# Patient Record
Sex: Female | Born: 1960 | Race: Black or African American | Hispanic: No | Marital: Married | Smoking: Current some day smoker
Health system: Southern US, Community
[De-identification: ages and names within clinical notes are randomized; demographics above are authoritative.]

---

## 2007-05-16 ENCOUNTER — Emergency Department (HOSPITAL_COMMUNITY): Admission: EM | Admit: 2007-05-16 | Discharge: 2007-05-16 | Payer: Self-pay | Admitting: Emergency Medicine

## 2008-01-07 IMAGING — CR DG CHEST 2V
2 series · 2 of 2 positions shown · non-contrast
Comparison: None.

CLINICAL DATA: Right-sided chest pain. Shortness of breath.

CHEST - 2 VIEW  05/16/2007:

[view not recorded (1 of 2)]
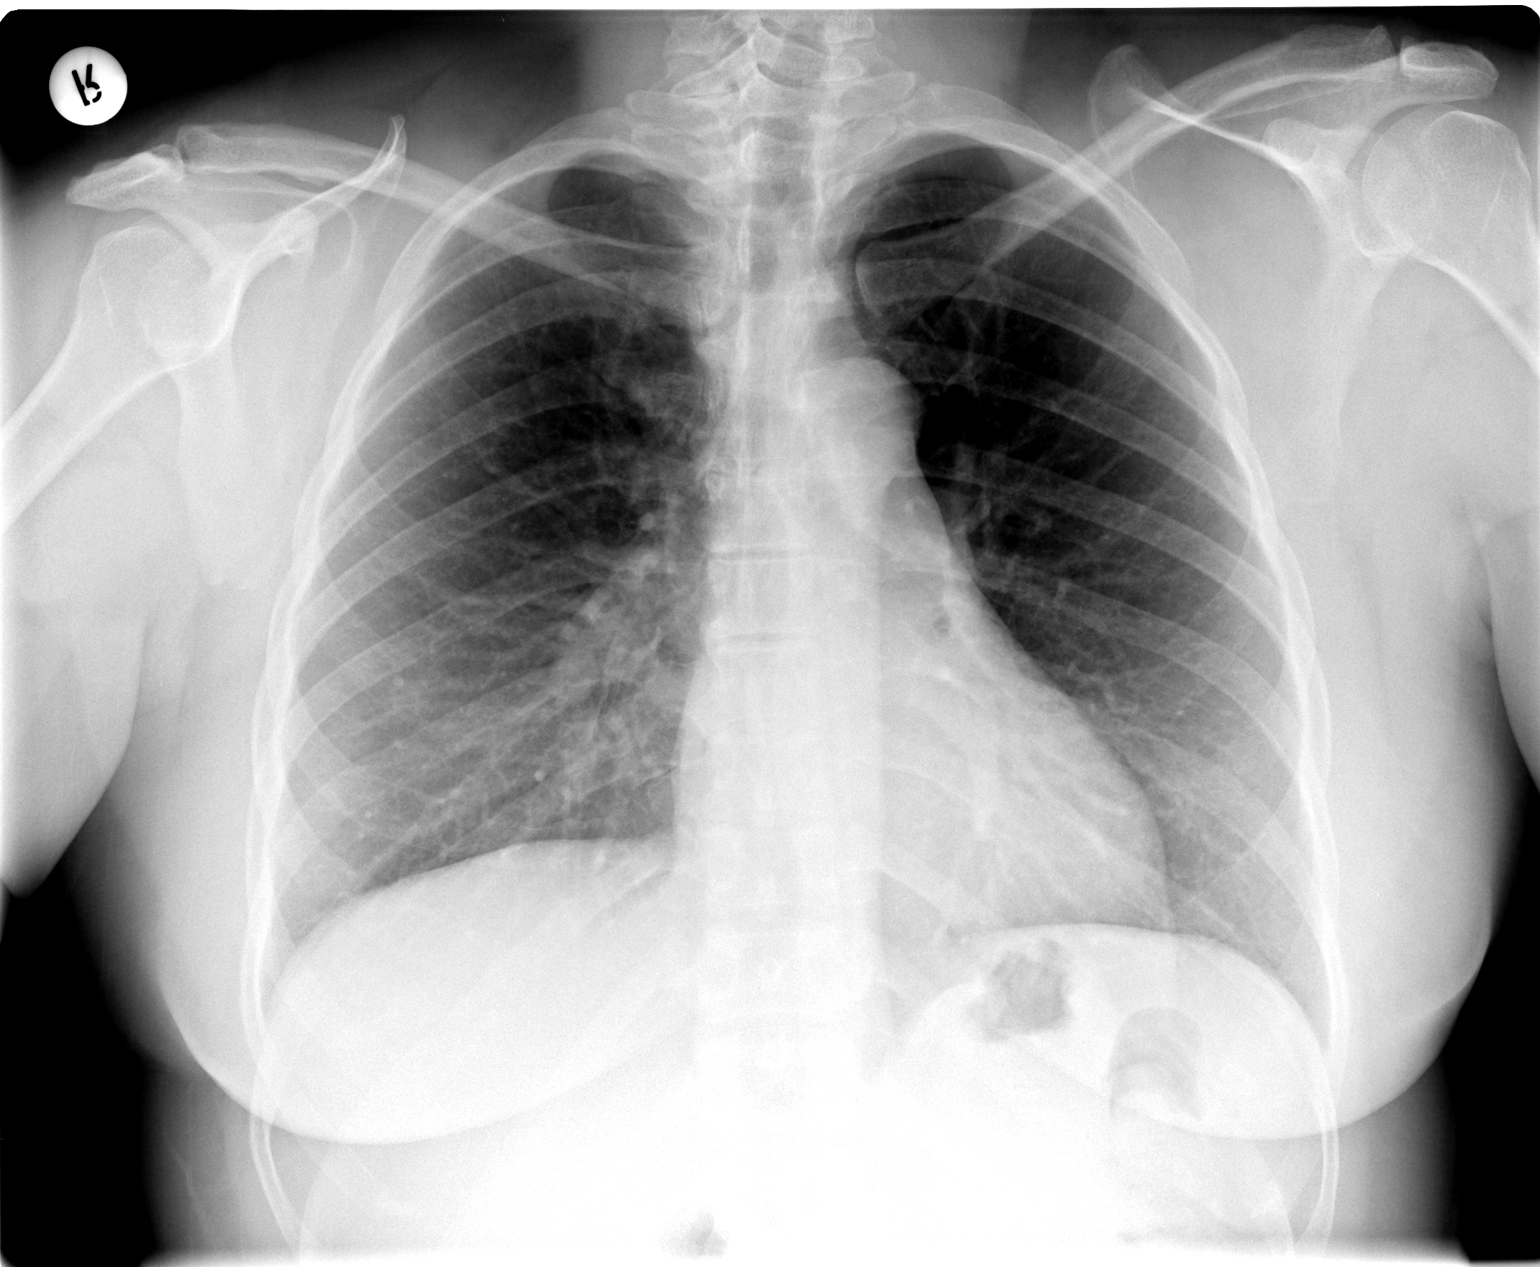

[view not recorded (2 of 2)]
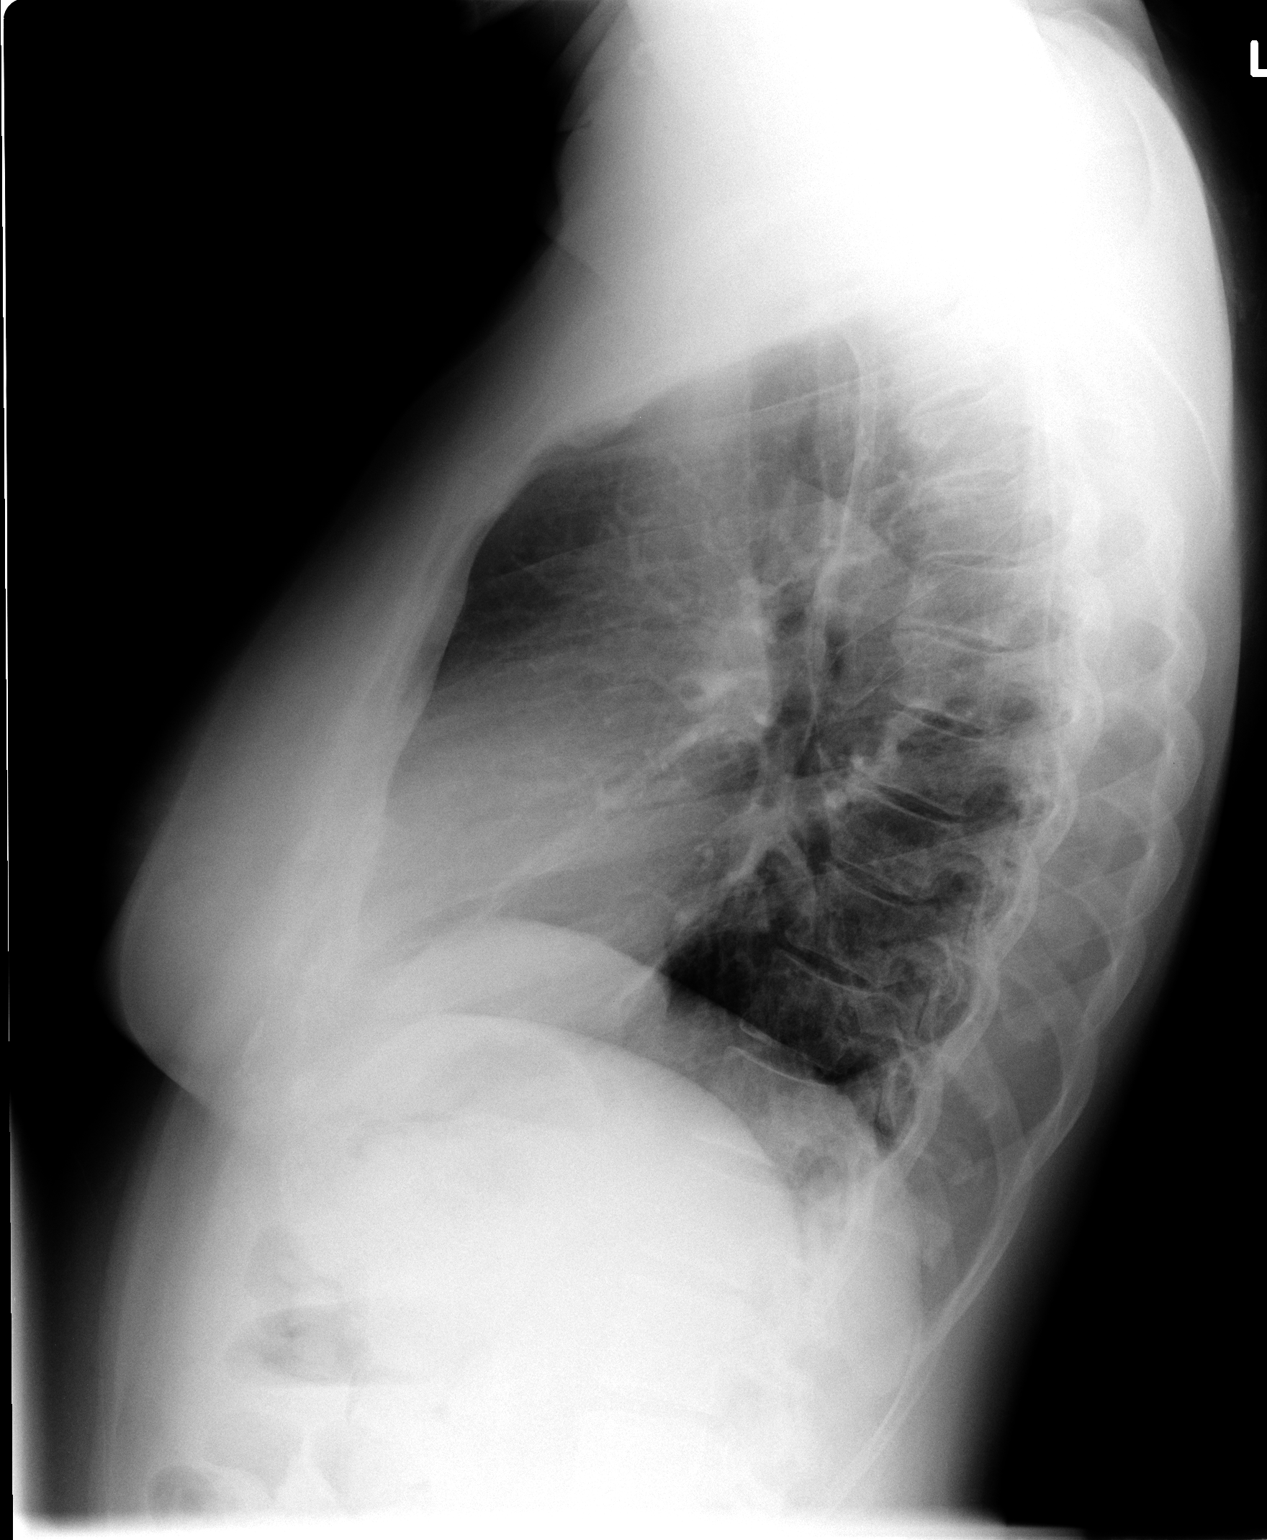

[2 of 2 positions shown; findings below may reference images not displayed]

FINDINGS: Cardiomediastinal silhouette unremarkable for age. Lungs clear. No
pleural effusions. Minimal degenerative changes in the midthoracic spine.
IMPRESSION: No acute cardiopulmonary disease.

## 2010-06-03 ENCOUNTER — Emergency Department (HOSPITAL_COMMUNITY): Admission: EM | Admit: 2010-06-03 | Discharge: 2010-06-03 | Payer: Self-pay | Admitting: Emergency Medicine

## 2011-08-27 LAB — DIFFERENTIAL
Basophils Absolute: 0
Basophils Relative: 1
Eosinophils Absolute: 0.2
Eosinophils Relative: 4
Lymphocytes Relative: 34
Lymphs Abs: 1.7
Monocytes Absolute: 0.3
Monocytes Relative: 7
Neutro Abs: 2.7
Neutrophils Relative %: 55

## 2011-08-27 LAB — I-STAT 8, (EC8 V) (CONVERTED LAB)
BUN: 13
Chloride: 107
Glucose, Bld: 94
Hemoglobin: 13.3
Potassium: 4
Sodium: 137
pH, Ven: 7.453 — ABNORMAL HIGH

## 2011-08-27 LAB — CBC
HCT: 36.9
Hemoglobin: 12.7
MCHC: 34.4
MCV: 92.2
Platelets: 260
RBC: 4
RDW: 13.6
WBC: 4.9

## 2011-08-27 LAB — D-DIMER, QUANTITATIVE: D-Dimer, Quant: 0.32

## 2011-08-27 LAB — POCT I-STAT CREATININE: Operator id: 270961

## 2022-04-06 ENCOUNTER — Encounter (HOSPITAL_COMMUNITY): Payer: Self-pay | Admitting: Emergency Medicine

## 2022-04-06 ENCOUNTER — Other Ambulatory Visit: Payer: Self-pay

## 2022-04-06 ENCOUNTER — Encounter (HOSPITAL_COMMUNITY)

## 2022-04-06 ENCOUNTER — Ambulatory Visit (HOSPITAL_COMMUNITY): Admission: EM | Admit: 2022-04-06 | Discharge: 2022-04-06 | Disposition: A | Discharge status: Home / Self Care

## 2022-04-06 ENCOUNTER — Encounter (HOSPITAL_COMMUNITY): Payer: Self-pay | Admitting: *Deleted

## 2022-04-06 ENCOUNTER — Emergency Department (HOSPITAL_BASED_OUTPATIENT_CLINIC_OR_DEPARTMENT_OTHER)

## 2022-04-06 ENCOUNTER — Emergency Department (HOSPITAL_COMMUNITY)
Admission: EM | Admit: 2022-04-06 | Discharge: 2022-04-06 | Disposition: A | Discharge status: Home / Self Care | Attending: Emergency Medicine | Admitting: Emergency Medicine

## 2022-04-06 DIAGNOSIS — Z7901 Long term (current) use of anticoagulants: Secondary | ICD-10-CM | POA: Diagnosis not present

## 2022-04-06 DIAGNOSIS — M79661 Pain in right lower leg: Secondary | ICD-10-CM

## 2022-04-06 DIAGNOSIS — I82461 Acute embolism and thrombosis of right calf muscular vein: Secondary | ICD-10-CM | POA: Insufficient documentation

## 2022-04-06 DIAGNOSIS — M79604 Pain in right leg: Secondary | ICD-10-CM | POA: Diagnosis not present

## 2022-04-06 LAB — MAGNESIUM: Magnesium: 2.1 mg/dL (ref 1.7–2.4)

## 2022-04-06 LAB — BASIC METABOLIC PANEL
Anion gap: 9 (ref 5–15)
BUN: 16 mg/dL (ref 8–23)
CO2: 24 mmol/L (ref 22–32)
Calcium: 9.9 mg/dL (ref 8.9–10.3)
Chloride: 101 mmol/L (ref 98–111)
Creatinine, Ser: 1.42 mg/dL — ABNORMAL HIGH (ref 0.44–1.00)
GFR, Estimated: 42 mL/min — ABNORMAL LOW (ref >60)
Glucose, Bld: 105 mg/dL — ABNORMAL HIGH (ref 70–99)
Potassium: 4 mmol/L (ref 3.5–5.1)
Sodium: 134 mmol/L — ABNORMAL LOW (ref 135–145)

## 2022-04-06 MED ORDER — APIXABAN 5 MG PO TABS
10.0000 mg | ORAL_TABLET | ORAL | Status: AC
  Administered 2022-04-06: 10 mg via ORAL
  Filled 2022-04-06: qty 2

## 2022-04-06 MED ORDER — APIXABAN 5 MG PO TABS: ORAL_TABLET | ORAL | 0 refills | Status: AC

## 2022-04-06 NOTE — Progress Notes (Signed)
VASCULAR LAB    Right lower extremity venous duplex has been performed.  See CV proc for preliminary results.  Gave Wylder Alvo, PA-C verbal report  Sherren Kerns, RVT 04/06/2022, 4:55 PM

## 2022-04-06 NOTE — ED Provider Notes (Signed)
[facility]    CSN: [id number] Arrival date & time: 04/06/22  1209      History   Chief Complaint Chief Complaint  Patient presents with   Leg Pain    HPI Rebecca Love is a 61 y.o. female.   Patient presents urgent care for evaluation of her right calf pain that started 2 days ago on Thursday, Apr 04, 2022 after she drove to [place] from [place].  Pain became worse Friday morning and to Friday night, so patient took a muscle relaxer pill that she had at home with no relief of pain and the muscle relaxer only made her sleepy.  She has also been taking Tylenol 500 mg with minimal relief.  Right leg is uncomfortable to the point where she struggles to drive and get in and out of her car.  She walks with an antalgic gait due to right calf pain.  Right calf is not warm or swollen.  Area of greatest tenderness is behind right knee.  Patient received a cortisone injection to right knee for her ongoing knee problems on February 25, 2022.  She states that this pain feels different.  She denies history of deep vein thrombosis.  No shortness of breath, chest pain, nausea, fever, chills, or abdominal pain reported.  Pain is worse when he is in extended position and relieved when knee is flexed at 90 degrees.  Right calf pain is currently a 9 on a scale of 0-10 and patient describes pain as a pulling sensation.   Leg Pain  History reviewed. No pertinent past medical history.  There are no problems to display for this patient.   History reviewed. No pertinent surgical history.  OB History   No obstetric history on file.      Home Medications    Prior to Admission medications   Not on File    Family History History reviewed. No pertinent family history.  Social History Social History   Tobacco Use   Smoking status: Some Days    Types: Cigarettes   Smokeless tobacco: Never  Substance Use Topics   Alcohol use: Yes   Drug use: Not Currently     Allergies    Lisinopril   Review of Systems Review of Systems Per HPI  Physical Exam Triage Vital Signs ED Triage Vitals  Enc Vitals Group     BP 04/06/22 1313 114/75     Pulse Rate 04/06/22 1313 68     Resp 04/06/22 1313 18     Temp 04/06/22 1313 98.8 F (37.1 C)     Temp src --      SpO2 04/06/22 1313 96 %     Weight --      Height --      Head Circumference --      Peak Flow --      Pain Score 04/06/22 1310 8     Pain Loc --      Pain Edu? --      Excl. in GC? --    No data found.  Updated Vital Signs BP 114/75   Pulse 68   Temp 98.8 F (37.1 C)   Resp 18   SpO2 96%   Visual Acuity Right Eye Distance:   Left Eye Distance:   Bilateral Distance:    Right Eye Near:   Left Eye Near:    Bilateral Near:     Physical Exam Vitals and nursing note reviewed.  Constitutional:  General: She is not in acute distress.    Appearance: Normal appearance. She is well-developed. She is not ill-appearing.  HENT:     Head: Normocephalic and atraumatic.     Right Ear: External ear normal.     Left Ear: External ear normal.     Nose: Nose normal.     Mouth/Throat:     Mouth: Mucous membranes are moist.  Eyes:     Extraocular Movements: Extraocular movements intact.     Conjunctiva/sclera: Conjunctivae normal.  Cardiovascular:     Rate and Rhythm: Normal rate and regular rhythm.     Heart sounds: Normal heart sounds. No murmur heard.   No friction rub. No gallop.  Pulmonary:     Effort: Pulmonary effort is normal. No respiratory distress.     Breath sounds: Normal breath sounds. No wheezing, rhonchi or rales.  Chest:     Chest wall: No tenderness.  Abdominal:     Palpations: Abdomen is soft.  Musculoskeletal:        General: No swelling.     Cervical back: Neck supple.     Right lower leg: Tenderness present. No edema.     Left lower leg: No tenderness. No edema.     Comments: Calf and popliteal area tenderness to right knee with palpation.  Positive Homans' sign.   Patient extremely tender to calf with extension of right knee when her leg is straightened.  Pain is relieved when she is able to bend her right knee.  Pain is worsened with walking and bearing any weight on her right leg.  Right calf is not red, swollen, or warm to touch.  No pain to left calf or left knee.  +2 anterior tibialis pulses bilaterally.  No numbness or tingling reported to bilateral lower extremities.  She is neurovascularly intact.  Skin:    General: Skin is warm and dry.     Capillary Refill: Capillary refill takes less than 2 seconds.     Findings: No rash.  Neurological:     General: No focal deficit present.     Mental Status: She is alert and oriented to person, place, and time. Mental status is at baseline.     Motor: No weakness.     Gait: Gait abnormal.     Comments: Antalgic gait due to right calf pain.  Psychiatric:        Mood and Affect: Mood normal.        Behavior: Behavior normal.        Thought Content: Thought content normal.        Judgment: Judgment normal.     UC Treatments / Results  Labs (all labs ordered are listed, but only abnormal results are displayed) Labs Reviewed - No data to display  EKG   Radiology No results found.  Procedures Procedures (including critical care time)  Medications Ordered in UC Medications - No data to display  Initial Impression / Assessment and Plan / UC Course  I have reviewed the triage vital signs and the nursing notes.  Pertinent labs & imaging results that were available during my care of the patient were reviewed by me and considered in my medical decision making (see chart for details).  Patient is a 61 year old female presenting to urgent care today with right calf pain and popliteal knee pain that started 3 days ago.  Calf is not red, swollen, or warm to touch, but Denna Haggard' sign is positive and patient walks with an antalgic  gait.  She denies any recent long distance travel other than traveling from  [place] to [place] days ago by car.  No chest pain, fever/chills or shortness of breath at this time.  Cardiopulmonary exam and vital signs are stable at this time.  Cannot rule out deep vein thrombosis due to lack of advanced imaging at urgent care.  Recommend patient go to the emergency department for vascular ultrasound study of the right leg due to symptoms.  Patient verbalizes understanding and agreement with plan.  Discussed risks of deferring emergency evaluation today with patient.  Patient to report to closest emergency department immediately for evaluation.  She is stable to go by personal vehicle.  Patient discharged in stable condition from urgent care.  Final Clinical Impressions(s) / UC Diagnoses   Final diagnoses:  Right leg pain     Discharge Instructions      Please report to the nearest emergency department immediately for further evaluation due to possible blood clot in your right leg causing your leg pain.     ED Prescriptions   None    PDMP not reviewed this encounter.   Carlisle BeersStanhope, Anber Mckiver M, [place]NP 04/06/22 1525

## 2022-04-06 NOTE — ED Triage Notes (Signed)
C/o R calf pain x 1 week that is worse since driving to [place] from [place] on Thursday.  Denies SOB.  Denies injury.

## 2022-04-06 NOTE — ED Provider Notes (Signed)
[facility] EM[facility]ENC[facility] Provider Note   CSN: [id number] Arrival date & time: 04/06/22  1404     History  Chief Complaint  Patient presents with   Leg Pain    Phynix B Wirtanen is a 61 y.o. female.   Leg Pain Patient is a 61 year old female with a past medical history significant for hypertension, hyperlipidemia on medications for the above  She is presented to the emergency room today with complaints of some mild right calf pain for the past week but over the past 3 days she has had significant worsening in her symptoms.  She states that 5/25 she notes that her symptoms got worse after she drove from [place] to [place].  She states after that time she had general worsening of her symptoms.  She did take some Tylenol before arrival in the ER today and states that she had some improvement.  She states that the discomfort is affecting her walking.  She denies any trauma to her leg.  She is not a cancer patient and she denies any chest pain or difficulty breathing.  She denies any redness or swelling of her leg.  It seems that the focal point of her pain is right behind her right knee.  She tells me that she has a history of arthritis and knee pain as a result of this that has required steroid injections however she states that this pain feels different is not so much worse with range of motion as it is constantly painful.  No history of VTE.  She denies any rash.  She denies any abdominal pain nausea vomiting diarrhea fevers cough congestion lightheadedness or dizziness.    Home Medications Prior to Admission medications   Medication Sig Start Date End Date Taking? Authorizing Provider  apixaban (ELIQUIS) 5 MG TABS tablet Take 2 tablets (10mg ) twice daily for 7 days, then 1 tablet (5mg ) twice daily 04/06/22  Yes Virat Prather, [place] S, PA      Allergies    Lisinopril    Review of Systems   Review of Systems  Physical Exam Updated Vital Signs BP 128/86    Pulse (!) 53   Temp 98.6 F (37 C) (Oral)   Resp 15   SpO2 99%  Physical Exam Vitals and nursing note reviewed.  Constitutional:      General: She is not in acute distress. HENT:     Head: Normocephalic and atraumatic.     Nose: Nose normal.  Eyes:     General: No scleral icterus. Cardiovascular:     Rate and Rhythm: Normal rate and regular rhythm.     Pulses: Normal pulses.     Heart sounds: Normal heart sounds.     Comments: DP PT pulses 2+ and symmetric.   Pulmonary:     Effort: Pulmonary effort is normal. No respiratory distress.     Breath sounds: No wheezing.  Abdominal:     Palpations: Abdomen is soft.     Tenderness: There is no abdominal tenderness.  Musculoskeletal:     Cervical back: Normal range of motion.     Right lower leg: No edema.     Left lower leg: No edema.     Comments: Able to move at the knee with some discomfort with flexing at the knee.  Skin:    General: Skin is warm and dry.     Capillary Refill: Capillary refill takes less than 2 seconds.     Comments: No cellulitis or rash on  the right lower extremity  Neurological:     Mental Status: She is alert. Mental status is at baseline.  Psychiatric:        Mood and Affect: Mood normal.        Behavior: Behavior normal.    ED Results / Procedures / Treatments   Labs (all labs ordered are listed, but only abnormal results are displayed) Labs Reviewed  BASIC METABOLIC PANEL - Abnormal; Notable for the following components:      Result Value   Sodium 134 (*)    Glucose, Bld 105 (*)    Creatinine, Ser 1.42 (*)    GFR, Estimated 42 (*)    All other components within normal limits  MAGNESIUM    EKG None  Radiology VAS [place] LOWER EXTREMITY VENOUS (DVT) (7a-7p)  Result Date: 04/06/2022  Lower Venous DVT Study Patient Name:  RAEGEN TARPLEY  Date of Exam:   04/06/2022 Medical Rec #: [id number]          Accession #:    [id number] Date of Birth: Jul 09, 1961          Patient Gender: F Patient Age:    32 years Exam Location:  [facility] Procedure:      VAS [place] LOWER EXTREMITY VENOUS (DVT) Referring Phys: Stevphen Meuse Amauri Medellin --------------------------------------------------------------------------------  Indications: Posterior calf pain. Patient states the pain feels like a Charlie horse.  Comparison Study: No prior study on file Performing Technologist: Sherren Kerns RVS  Examination Guidelines: A complete evaluation includes B-mode imaging, spectral Doppler, color Doppler, and power Doppler as needed of all accessible portions of each vessel. Bilateral testing is considered an integral part of a complete examination. Limited examinations for reoccurring indications may be performed as noted. The reflux portion of the exam is performed with the patient in reverse Trendelenburg.  +---------+---------------+---------+-----------+----------+-------------------+ RIGHT    CompressibilityPhasicitySpontaneityPropertiesThrombus Aging      +---------+---------------+---------+-----------+----------+-------------------+ CFV      Full           Yes      Yes                                      +---------+---------------+---------+-----------+----------+-------------------+ SFJ      Full                                                             +---------+---------------+---------+-----------+----------+-------------------+ FV Prox  Full                                                             +---------+---------------+---------+-----------+----------+-------------------+ FV Mid   Full                                                             +---------+---------------+---------+-----------+----------+-------------------+ FV DistalFull                                                             +---------+---------------+---------+-----------+----------+-------------------+  PFV      Full                                                              +---------+---------------+---------+-----------+----------+-------------------+ POP      Full           Yes      Yes                                      +---------+---------------+---------+-----------+----------+-------------------+ PTV      None           No       No                   Acute proximal calf                                                       at site of pain     +---------+---------------+---------+-----------+----------+-------------------+ PERO     Full                                                             +---------+---------------+---------+-----------+----------+-------------------+ Gastroc  Full                                                             +---------+---------------+---------+-----------+----------+-------------------+   +----+---------------+---------+-----------+----------+--------------+ LEFTCompressibilityPhasicitySpontaneityPropertiesThrombus Aging +----+---------------+---------+-----------+----------+--------------+ CFV Full           Yes      Yes                                 +----+---------------+---------+-----------+----------+--------------+     Summary: RIGHT: - Findings consistent with acute deep vein thrombosis involving the right posterior tibial veins in the proximal portion of the calf at site of pain. - No cystic structure found in the popliteal fossa.  LEFT: - No evidence of common femoral vein obstruction.  *See table(s) above for measurements and observations.    Preliminary     Procedures Procedures    Medications Ordered in ED Medications  apixaban (ELIQUIS) tablet 10 mg (has no administration in time range)    ED Course/ Medical Decision Making/ A&P Clinical Course as of 04/06/22 1756  Sat Apr 06, 2022  1506 1 week of R calf pain.   [WF]  1511 Last labs 3 months ago. She states were normal.  [WF]  1631 Reviewed labs from 05/01/2021 Creatinine 1.15 High    [WF]  1633 DVT +  Will  initiate on DOAC w pharmacy providing counseling and coupon  [WF]    Clinical Course User Index [WF]  Gailen Shelter, PA                           Medical Decision Making Amount and/or Complexity of Data Reviewed Labs: ordered.  Risk Prescription drug management.   This patient presents to the ED for concern of right leg pain, this involves a number of treatment options, and is a complaint that carries with it a moderate to high risk of complications and morbidity.  The differential diagnosis includes DVT, arterial injury or occlusion, fracture, complex regional pain syndrome, compartment syndrome, bursitis, Baker's cyst, muscle injury   Co morbidities: Discussed in HPI   Brief History:  Patient is a 61 year old female with a past medical history significant for hypertension, hyperlipidemia on medications for the above  She is presented to the emergency room today with complaints of some mild right calf pain for the past week but over the past 3 days she has had significant worsening in her symptoms.  She states that 5/25 she notes that her symptoms got worse after she drove from [place] to [place].  She states after that time she had general worsening of her symptoms.  She did take some Tylenol before arrival in the ER today and states that she had some improvement.  She states that the discomfort is affecting her walking.  She denies any trauma to her leg.  She is not a cancer patient and she denies any chest pain or difficulty breathing.  She denies any redness or swelling of her leg.  It seems that the focal point of her pain is right behind her right knee.  She tells me that she has a history of arthritis and knee pain as a result of this that has required steroid injections however she states that this pain feels different is not so much worse with range of motion as it is constantly painful.  No history of VTE.  She denies any rash.  She denies any abdominal pain nausea  vomiting diarrhea fevers cough congestion lightheadedness or dizziness.    EMR reviewed including pt PMHx, past surgical history and past visits to ER.   See HPI for more details   Lab Tests:  I ordered and independently interpreted labs. Labs notable for marginal increase in creatinine from 1.15   1-year ago to 1.42 today.  She will need to have this rechecked with PCP.   Imaging Studies:  Abnormal findings. I personally reviewed all imaging studies. Imaging notable for  DVT of right posterior tibial veins in the proximal portion of the calf of the right lower extremity.  I personally viewed these images.  Agree with radiology read.  Cardiac Monitoring:  NA NA   Medicines ordered:  I ordered medication including Eliquis for DVT Reevaluation of the patient after these medicines showed that the patient stayed the same I have reviewed the patients home medicines and have made adjustments as needed   Critical Interventions:     Consults/Attending Physician   Discussed with Christiane Ha the pharmacy who provided patient with medication coupon and medication counseling.   Reevaluation:  After the interventions noted above I re-evaluated patient and found that they have :stayed the same   Social Determinants of Health:  Coupon provided by pharmacy    Problem List / ED Course:  DVT of right lower extremity.  Good distal pulses and reassuring physical exam.  Started on Eliquis.  Will need to follow-up with PCP closely. Early CKD?  Creatinine marginally elevated is not AKI.  Will need monitoring with PCP.   Dispostion:  After consideration of the diagnostic results and the patients response to treatment, I feel that the patent would benefit from close outpatient follow-up.  Return precautions discussed.  Final Clinical Impression(s) / ED Diagnoses Final diagnoses:  Acute deep vein thrombosis (DVT) of calf muscle vein of right lower extremity [facility])    Rx / DC  Orders ED Discharge Orders          Ordered    apixaban (ELIQUIS) 5 MG TABS tablet        04/06/22 1738              Gailen Shelter, [place] 04/06/22 1758    Terrilee Files, MD 04/07/22 1032

## 2022-04-06 NOTE — ED Notes (Signed)
ACE wrap applied to RLE.

## 2022-04-06 NOTE — Discharge Instructions (Addendum)
I have printed you some information about a DVT or deep vein thrombosis AKA clots.  Please follow-up with your primary care doctor.  Please take all of your prescribed indications.  I have written you a prescription for this medication.  Please take as directed.  You have had the first dose here in the ER images the next dose tomorrow morning which will be part of the prescribed medicines.  Follow directions.  Please return to the ER if any new or concerning symptoms such as chest pain or difficulty breathing.   Warm compresses and elevating her leg can help.    Information on my medicine - ELIQUIS (apixaban)   Why was Eliquis prescribed for you? Eliquis was prescribed for you to reduce the risk of forming blood clots that can cause a stroke if you have a medical condition called atrial fibrillation (a type of irregular heartbeat) OR to reduce the risk of a blood clots forming after orthopedic surgery.  What do You need to know about Eliquis ? Take your Eliquis TWICE DAILY - one tablet in the morning and one tablet in the evening with or without food.  It would be best to take the doses about the same time each day.  If you have difficulty swallowing the tablet whole please discuss with your pharmacist how to take the medication safely.  Take Eliquis exactly as prescribed by your doctor and DO NOT stop taking Eliquis without talking to the doctor who prescribed the medication.  Stopping may increase your risk of developing a new clot or stroke.  Refill your prescription before you run out.  After discharge, you should have regular check-up appointments with your healthcare provider that is prescribing your Eliquis.  In the future your dose may need to be changed if your kidney function or weight changes by a significant amount or as you get older.  What do you do if you miss a dose? If you miss a dose, take it as soon as you remember on the same day and resume taking twice daily.   Do not take more than one dose of ELIQUIS at the same time.  Important Safety Information A possible side effect of Eliquis is bleeding. You should call your healthcare provider right away if you experience any of the following: Bleeding from an injury or your nose that does not stop. Unusual colored urine (red or dark brown) or unusual colored stools (red or black). Unusual bruising for unknown reasons. A serious fall or if you hit your head (even if there is no bleeding).  Some medicines may interact with Eliquis and might increase your risk of bleeding or clotting while on Eliquis. To help avoid this, consult your healthcare provider or pharmacist prior to using any new prescription or non-prescription medications, including herbals, vitamins, non-steroidal anti-inflammatory drugs (NSAIDs) and supplements.  This website has more information on Eliquis (apixaban): http://www.eliquis.com/eliquis/home

## 2022-04-06 NOTE — Discharge Instructions (Signed)
Please report to the nearest emergency department immediately for further evaluation due to possible blood clot in your right leg causing your leg pain.

## 2022-04-06 NOTE — ED Notes (Signed)
Pt verbalizes understanding of discharge instructions. Opportunity for questions and answers were provided. Pt discharged from the ED.   ?

## 2022-04-06 NOTE — ED Triage Notes (Signed)
Pt reports leg pain located to posterior Rt lower leg. Pt having muscle spasms to Rt  lower leg. Pt reports Rt lower leg feels heavy .
